# Patient Record
Sex: Male | Born: 2000
Health system: Southern US, Community
[De-identification: ages and names within clinical notes are randomized; demographics above are authoritative.]

---
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
## 2022-02-24 ENCOUNTER — Emergency Department (HOSPITAL_BASED_OUTPATIENT_CLINIC_OR_DEPARTMENT_OTHER)
Admission: EM | Admit: 2022-02-24 | Discharge: 2022-02-24 | Disposition: A | Discharge status: Home / Self Care | Payer: Medicaid Other | Attending: Emergency Medicine | Admitting: Emergency Medicine

## 2022-02-24 ENCOUNTER — Other Ambulatory Visit: Payer: Self-pay

## 2022-02-24 ENCOUNTER — Encounter (HOSPITAL_BASED_OUTPATIENT_CLINIC_OR_DEPARTMENT_OTHER): Payer: Self-pay | Admitting: Emergency Medicine

## 2022-02-24 DIAGNOSIS — U071 COVID-19: Secondary | ICD-10-CM

## 2022-02-24 DIAGNOSIS — R000 Tachycardia, unspecified: Secondary | ICD-10-CM | POA: Diagnosis not present

## 2022-02-24 DIAGNOSIS — R059 Cough, unspecified: Secondary | ICD-10-CM | POA: Diagnosis present

## 2022-02-24 LAB — GROUP A STREP BY PCR: Group A Strep by PCR: NOT DETECTED

## 2022-02-24 LAB — RESP PANEL BY RT-PCR (RSV, FLU A&B, COVID)  RVPGX2
Influenza A by PCR: NEGATIVE
Influenza B by PCR: NEGATIVE
Resp Syncytial Virus by PCR: NEGATIVE
SARS Coronavirus 2 by RT PCR: POSITIVE — AB

## 2022-02-24 MED ORDER — BENZONATATE 100 MG PO CAPS: 100.0000 mg | ORAL_CAPSULE | Freq: Three times a day (TID) | ORAL | 0 refills | Status: DC

## 2022-02-24 NOTE — ED Provider Notes (Signed)
[facility] EMERGENCY DEPT Provider Note   CSN: [id number] Arrival date & time: 02/24/22  1553     History  Chief Complaint  Patient presents with   Cough    Gary Houston is a 22 y.o. male presenting with viral symptoms.  Sister is also sick.  Mother's boyfriend is also sick.  No shortness of breath.  No nausea vomiting or diarrhea.  Complaining of congestion, rhinorrhea and cough.   Cough      Home Medications Prior to Admission medications   Medication Sig Start Date End Date Taking? Authorizing Provider  benzonatate (TESSALON) 100 MG capsule Take 1 capsule (100 mg total) by mouth every 8 (eight) hours. 02/24/22  Yes Coburn Knaus A, PA-C  benzonatate (TESSALON) 100 MG capsule Take 1 capsule (100 mg total) by mouth every 8 (eight) hours. 02/24/22   Dilcia Rybarczyk A, PA-C      Allergies    Patient has no known allergies.    Review of Systems   Review of Systems  Respiratory:  Positive for cough.     Physical Exam Updated Vital Signs BP (!) 145/95 (BP Location: Right Arm)   Pulse (!) 109   Temp 98.4 F (36.9 C)   Resp 18   SpO2 100%  Physical Exam Vitals and nursing note reviewed.  Constitutional:      General: He is not in acute distress.    Appearance: Normal appearance. He is not ill-appearing.  HENT:     Head: Normocephalic and atraumatic.     Nose: Congestion and rhinorrhea present.     Mouth/Throat:     Mouth: Mucous membranes are moist.     Pharynx: Oropharynx is clear.  Eyes:     General: No scleral icterus.    Conjunctiva/sclera: Conjunctivae normal.  Cardiovascular:     Rate and Rhythm: Regular rhythm. Tachycardia present.  Pulmonary:     Effort: Pulmonary effort is normal. No respiratory distress.     Breath sounds: No wheezing or rales.  Skin:    General: Skin is warm and dry.     Findings: No rash.  Neurological:     Mental Status: He is alert.  Psychiatric:        Mood and Affect: Mood normal.     ED  Results / Procedures / Treatments   Labs (all labs ordered are listed, but only abnormal results are displayed) Labs Reviewed  RESP PANEL BY RT-PCR (RSV, FLU A&B, COVID)  [id number] - Abnormal; Notable for the following components:      Result Value   SARS Coronavirus 2 by RT PCR POSITIVE (*)    All other components within normal limits  GROUP A STREP BY PCR    EKG None  Radiology No results found.  Procedures Procedures   Medications Ordered in ED Medications - No data to display  ED Course/ Medical Decision Making/ A&P                           Medical Decision Making Risk Prescription drug management.   22 year old presenting today with URI symptoms.  Symptoms have been going on for 3 days.  On physical exam they are stable in appearance.  Tested positive for COVID. We discussed that COVID is something that needs to run its course and they may use ibuprofen, Tylenol, DayQuil/NyQuil and other over-the-counter medications for their symptoms.  Return precautions discussed and they understand that they may follow-up on  their symptoms outpatient with either PCP or urgent care as needed for nonemergent symptoms.  Agreeable to discharge at this time.    Final Clinical Impression(s) / ED Diagnoses Final diagnoses:  [id number]    Rx / DC Orders ED Discharge Orders          Ordered    benzonatate (TESSALON) 100 MG capsule  Every 8 hours        02/24/22 1707    benzonatate (TESSALON) 100 MG capsule  Every 8 hours        02/24/22 1723           Results and diagnoses were explained to the patient. Return precautions discussed in full. Patient had no additional questions and expressed complete understanding.   This chart was dictated using voice recognition software.  Despite best efforts to proofread,  errors can occur which can change the documentation meaning.    Rhae Hammock, PA-C 14/78/29 5621    Lianne Cure, DO 30/86/57 2358

## 2022-02-24 NOTE — ED Triage Notes (Signed)
Cough sore throat started a few days ago. Theraflu with minimal symptom improvement

## 2022-02-24 NOTE — Discharge Instructions (Addendum)
You came to the department today due to flulike symptoms.  You tested + covid.  This sort of viral illness is something that can be treated with over-the-counter medications like Tylenol and ibuprofen.  Other options include DayQuil/NyQuil, Mucinex for congestion, Robitussin/Delsym for cough and any other over-the-counter medications.  It is very important that you stay hydrated during this time as well.  You may use things like Powerade, Gatorade, electrolyte powders and water.  We hope that you feel better and do not hesitate to return to the emergency department with any worsening symptoms, especially chest pain, shortness of breath, dizziness and loss of consciousness.

## 2022-02-24 NOTE — ED Notes (Signed)
Patient and parent verbalizes understanding of discharge instructions. Opportunity for questioning and answers were provided. Patient discharged from ED with parent.  

## 2022-04-11 ENCOUNTER — Encounter (INDEPENDENT_AMBULATORY_CARE_PROVIDER_SITE_OTHER): Payer: Self-pay | Admitting: Pediatrics

## 2022-04-14 ENCOUNTER — Encounter (INDEPENDENT_AMBULATORY_CARE_PROVIDER_SITE_OTHER): Payer: Self-pay | Admitting: Pediatrics

## 2022-04-15 ENCOUNTER — Encounter (INDEPENDENT_AMBULATORY_CARE_PROVIDER_SITE_OTHER): Payer: Self-pay | Admitting: Pediatrics

## 2022-04-15 ENCOUNTER — Ambulatory Visit (INDEPENDENT_AMBULATORY_CARE_PROVIDER_SITE_OTHER): Payer: Medicaid Other | Admitting: Pediatrics

## 2022-04-15 VITALS — BP 112/70 | HR 68 | Ht 66.73 in | Wt 145.0 lb

## 2022-04-15 DIAGNOSIS — R620 Delayed milestone in childhood: Secondary | ICD-10-CM

## 2022-04-15 DIAGNOSIS — Q9382 Williams syndrome: Secondary | ICD-10-CM | POA: Diagnosis not present

## 2022-04-15 NOTE — Progress Notes (Signed)
Pediatric Teaching Program [address] 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  He is accompanied to clinic by this mother, Gary Houston, and maternal grandmother, Gary Houston.   Gary Houston was last evaluated in the [facility] clinic in October 2013 when Gary Houston was nearly 22 years of age. His features and medical history were suggestive a Williams syndrome at that time.  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  Additional molecular cytogenetic study showed the absence of the ELN gene for one chromosome.  CARDIOVASCULAR:  Gary Houston has a history of mild pulmonary valve stenosis and was followed as an infant and child by pediatric cardiologist, Dr. Fletcher Anon and then [facility] pediatric cardiology.  An echocardiogram in 2009 was normal.  A more recent cardiology evaluation by pediatric cardiologist, Dr. Elvia Collum of [facility] was normal.  There is no known hypertension.  HEENT: Gary Houston mother reports no concerns regarding vision or hearing. There has been regular follow-up.  However, Gary Houston's mother reports a need for orthodontic evaluations.   DERM:  Gary Houston was evaluated three years ago for hyperpigmented arm lesions. No specific treatment was deemed necessary and the differential diagnosis was  "lichen simplex chronicus versus localized cutis laxa."  MSK: In 2018, Gary Houston was evaluated for a ganglion cyst of the foot that was excised [facility]).   NEURO/DEVELOPMENTAL:  Gary Houston did have an IEP throughout his school programs and was most recently home-schooled (from 10th grade on)  with completion of high school.    There was early concern for hypotonia.  A brain MRI performed at [facility] in  2010 showed no intracranial abnormality.  There has not been an indication for further brain imaging.   Other review of systems:  Gary Houston has not had any recent surgeries.  There was a COVID19 infection 2 months ago. There have not been seizures.   FAMILY HISTORY UPDATE: Gary Houston 85 year old sister will attend [employer] [place] this fall with the plans for an engineering major. There are no further updates.   Physical Examination: BP 112/70   Pulse 68   Ht 5' 6.73" (1.695 m)   Wt 65.8 kg   HC 58.4 cm (23")   BMI 22.89 kg/m  WEIGHT 36th percentile (75th-90th Williams syndrome growth curve); Height 16th percentile (75th-90th Williams syndrome growth curve)   Head/facies    Somewhat low anterior hairline. Head measurement with braided hair  HC: (75th-90th Williams syndrome growth curve)  Eyes PERRL, periorbital fullness,    Ears Normally formed  Mouth Full and wide lips. Some dental malocclusion  Neck Slightly long neck, no thyromegaly  Chest No murmur  Abdomen No umbilical hernia  Genitourinary Normal male, TANNER stage V.   Musculoskeletal Mild hyperextensibility of wrists, no scoliosis  Neuro Normal tone and strength.   Skin/Integument No unusual skin lesions   ASSESSMENT:  Gary Houston is a 22 year old male who has a diagnosis of Williams syndrome when he was nearly 22 years of age.  He has some characteristic facial features of Williams syndrome as well as history of mild pulmonic stenosis. Gary Houston's blood pressure is normal today.  Gary Houston has completed his educational program up to [employer] level. He has not had any major medical problems.  The largest concern for the family is Gary Houston's transition to adult health care and life skills at the adult ages.   Genetic counselor, Zonia Kiefandi Stewart, [employer] genetic counseling intern, Shellia CleverlyAvia Sutton, and I reviewed the important aspects of care for adults with Williams syndrome.  The American Academy of Pediatrics has updated the guidelines in 2020 [place]  et al. [facility] Supervision for Children with Mayford KnifeWilliams Syndrome.  Pediatrics, 2020, 145e.    RESOURCES:   The Williams Syndrome Association has wonderful resources for children and adults with Williams syndrome. www.williams-syndrome.org    RECOMMENDATIONS:  Transitions for Life Skills Programs and Medical Care are being explored We provided information on [employer] [facility] Program We will also explore other options in our community and send that information to the family  The family is doing a wonderful job Doctor, hospitaladvocating for [employer].  We will continue to seek out resources that may be available in our community.     Link SnufferPamela J. Caralyn Twining, M.D., Ph.D. Clinical Professor, Pediatrics and Medical Genetics  Cc: Dr. Lavella HammockEndya Frye

## 2023-05-18 ENCOUNTER — Ambulatory Visit: Payer: Medicaid Other | Admitting: Family Medicine

## 2023-09-14 ENCOUNTER — Ambulatory Visit (INDEPENDENT_AMBULATORY_CARE_PROVIDER_SITE_OTHER): Payer: MEDICAID | Admitting: Podiatry

## 2023-09-14 DIAGNOSIS — B353 Tinea pedis: Secondary | ICD-10-CM | POA: Diagnosis not present

## 2023-09-14 MED ORDER — MUPIROCIN 2 % EX OINT: 1.0000 | TOPICAL_OINTMENT | Freq: Two times a day (BID) | CUTANEOUS | 2 refills | Status: AC

## 2023-09-14 MED ORDER — CLOTRIMAZOLE-BETAMETHASONE 1-0.05 % EX CREA: 1.0000 | TOPICAL_CREAM | Freq: Every day | CUTANEOUS | 2 refills | Status: AC

## 2023-09-14 MED ORDER — TRIAMCINOLONE ACETONIDE 0.5 % EX OINT: 1.0000 | TOPICAL_OINTMENT | Freq: Two times a day (BID) | CUTANEOUS | 2 refills | Status: AC

## 2023-09-14 NOTE — Progress Notes (Signed)
   Chief Complaint  Patient presents with   Skin Problem    Bilateral heel. Ongoing for 2 years. Patient has dry, thick skin on his heels. He has been clipping at it with fingernail clippers, scissors, whatever he is able to find. Has tried lotion, refuses to have pedicure. Mom and grandma are in room with him. Not diabetic and no anticoag. Patient has william's syndrome.     HPI: 23 y.o. male presenting today for evaluation of thick skin to the bilateral heels.  He normally tries to trim them out himself.  Presenting for further treatment evaluation  No past medical history on file.  No past surgical history on file.  No Known Allergies    RT heel 09/14/2023   Physical Exam: General: The patient is alert and oriented x3 in no acute distress.  Dermatology: Skin is warm, dry and supple bilateral lower extremities.  Hyperkeratotic skin noted to the bilateral heels with mild hydrocyst  Vascular: Palpable pedal pulses bilaterally. Capillary refill within normal limits.  No appreciable edema.  No erythema.  Neurological: Grossly intact via light touch  Musculoskeletal Exam: No pedal deformities noted  Assessment/Plan of Care: 1.  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 Thresa EMERSON Sar, DPM    [address] [place], [place] [address]                Office [phone]  Fax [phone]

## 2023-09-14 NOTE — Patient Instructions (Signed)
 Mupirocin  ointment for the foot Lotrisone  cream for the foot  Triamcinolone  (Kenalog ) ointment for the skin lesion

## 2024-01-13 ENCOUNTER — Ambulatory Visit (INDEPENDENT_AMBULATORY_CARE_PROVIDER_SITE_OTHER): Payer: MEDICAID | Admitting: Family Medicine

## 2024-01-13 VITALS — BP 132/73 | HR 117 | Temp 98.3°F | Wt 172.4 lb

## 2024-01-13 DIAGNOSIS — R030 Elevated blood-pressure reading, without diagnosis of hypertension: Secondary | ICD-10-CM | POA: Diagnosis not present

## 2024-01-13 DIAGNOSIS — Z7689 Persons encountering health services in other specified circumstances: Secondary | ICD-10-CM

## 2024-01-13 DIAGNOSIS — J310 Chronic rhinitis: Secondary | ICD-10-CM

## 2024-01-13 DIAGNOSIS — Q9382 Williams syndrome: Secondary | ICD-10-CM

## 2024-01-13 NOTE — Progress Notes (Signed)
 "  Established Patient Office Visit   Subjective  Patient ID: Gary Houston, male    DOB: 02/08/2001  Age: 23 y.o. MRN: [id number]  Chief Complaint  Patient presents with   Establish Care    Patient is new patient. Patient has nasal drip and guardian wants patient to get blood work done.    Pt is a 23 yo male seen for est care and f/u on chronic conditions.  Pt previously seen at [facility] peds by Dr. Delight.  He denies any current medication use and has no known allergies to medications or foods. He has not received a flu shot this year and his last physical was at age 42.  H/o Gary Houston' syndrome.  Dx'd after genetic testing.  Pt with persistent runny nose.  Frequently having to blow nose.  Has not tried any medications.  Denies throat irritation, watery or itchy eyes, ear pain, facial pain/pressure, cough, or headaches.  No prior h/o allergies.  May be in room with windows down more.  Birth hx:  Born via emergency C-section due to a drop in heart rate and had a small heart murmur at birth. He was born at nearly 41 weeks and had low oxygen levels at birth.  H/o heart mumur, now resolved.  Allergies: NKDA  Social hx: Pt is an introvert and tends to stay in his room.  He enjoys fast food.  Family history: Younger sister, age 43 AAW. MGM-h/o renal txp d/t renal failure 2/2 untreated abscessed tooth Dad- DM MGF-h/o cancer related to Agent Orange exposure, and no known history of cancer in other family members.      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 No past surgical history on file. Social History   Tobacco Use   Smoking status: Never    Passive exposure: Never   Smokeless tobacco: Never  Vaping Use   Vaping status: Never Used  Substance Use Topics   Alcohol use: Never   Drug use: Never   Family History  Problem  Relation Age of Onset   Kidney disease Maternal Grandmother    No Known Allergies  ROS Negative unless stated above    Objective:     BP 132/73 (BP Location: Left Arm, Patient Position: Sitting, Cuff Size: Normal)   Pulse (!) 117   Temp 98.3 F (36.8 C) (Oral)   Wt 172 lb 6.4 oz (78.2 kg)   SpO2 98%   BMI 27.22 kg/m  BP Readings from Last 3 Encounters:  01/13/24 132/73  04/15/22 112/70  02/24/22 (!) 145/95   Wt Readings from Last 3 Encounters:  01/13/24 172 lb 6.4 oz (78.2 kg)  04/15/22 145 lb (65.8 kg) (36%, Z= -0.35)*  11/22/19 149 lb 6.4 oz (67.8 kg) (63%, Z= 0.33)*   * Growth percentiles are based on CDC (Boys, 2-20 Years) data.      Physical Exam Constitutional:      General: He is not in acute distress.    Appearance: Normal appearance.  HENT:     Head: Normocephalic and atraumatic.     Right Ear: Hearing, tympanic membrane and ear canal normal. Tenderness present.     Left Ear: Hearing, tympanic membrane and ear canal normal.     Ears:     Comments: R canal tender.  No injury, erythema, induration, or drainage.    Nose:  Nose normal.     Mouth/Throat:     Mouth: Mucous membranes are moist.  Cardiovascular:     Rate and Rhythm: Normal rate and regular rhythm.     Heart sounds: Normal heart sounds. No murmur heard.    No gallop.  Pulmonary:     Effort: Pulmonary effort is normal. No respiratory distress.     Breath sounds: Normal breath sounds. No wheezing, rhonchi or rales.  Skin:    General: Skin is warm and dry.  Neurological:     Mental Status: He is alert and oriented to person, place, and time.     No results found for any visits on 01/13/24.    Assessment & Plan:   Chronic rhinitis  Williams syndrome  Elevated blood pressure reading without diagnosis of hypertension  Encounter to establish care  Allergies likely contributing to chronic rhinitis.  Discussed starting an OTC antihistamine, saline nasal rinse, or flonase.  Avoid leaving  windows down in room as may be causing increased symptoms.  Also consider heartburn.  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  ID a/w syndrome.  BP initially elevated.  Contributed to White coat HTN/meeting new provider.  Recheck improved, 132/73, but generally lower per chart review.  Monitor for HTN as 1/2 of pt's develop.  For continued elevation in BP, renal u/s and start norvasc or procardia.  Also monitor for hypercalcemia, hypothyroidism, and DM.  Can also have renal manifestations including frequent UTIs.  Annual cardiovascular, hearing, and vision screenings.  -We reviewed the PMH, PSH, FH, SH, Meds and Allergies. -We provided refills for any medications we will prescribe as needed. -We addressed current concerns per orders and patient instructions. -We have asked for records for pertinent exams, studies, vaccines and notes from previous providers. -We have advised patient to follow up per instructions below.   Return if symptoms worsen or fail to improve.   Gary Houston Single, MD "

## 2024-01-28 ENCOUNTER — Ambulatory Visit: Payer: Self-pay

## 2024-01-28 NOTE — Telephone Encounter (Signed)
 Pt has appt 02/11/17

## 2024-01-28 NOTE — Telephone Encounter (Signed)
 FYI Only or Action Required?: FYI only for provider: appointment scheduled on 01/29/24.  Patient was last seen in primary care on 01/13/2024 by Mercer Clotilda SAUNDERS, MD.  Called Nurse Triage reporting URI.  Symptoms began several weeks ago.  Interventions attempted: Nothing.  Symptoms are: unchanged.  Triage Disposition: See PCP When Office is Open (Within 3 Days)  Patient/caregiver understands and will follow disposition?:   Reason for Disposition  [1] Sinus congestion (pressure, fullness) AND [2] present > 10 days  [1] Nasal discharge AND [2] present > 10 days  Answer Assessment - Initial Assessment Questions 1. ONSET: When did the nasal discharge start?      Several weeks ago 2. AMOUNT: How much discharge is there?      Large amt 3. COUGH: Do you have a cough? If Yes, ask: Describe the color of your mucus. (e.g., clear, white, yellow, green)     Mucus is green at times. Cough mild 4. RESPIRATORY DISTRESS: Describe your breathing.      Denies sob 5. FEVER: Do you have a fever? If Yes, ask: What is your temperature, how was it measured, and when did it start?     no 6. SEVERITY: Overall, how bad are you feeling right now? (e.g., doesn't interfere with normal activities, staying home from school/work, staying in bed)      same 7. OTHER SYMPTOMS: Do you have any other symptoms? (e.g., earache, mouth sores, sore throat, wheezing)     no  Protocols used: Sinus Pain or Congestion-A-AH, Common Cold-A-AH  Reason for Triage: Patient mother called in stating he has symptoms of a Sinus Infection: He has had the following symptoms since before: Thanksgiving Holiday - Runny Nose/Mucus/ Thick White Mucus wont go away. - Coughing

## 2024-01-29 ENCOUNTER — Ambulatory Visit (INDEPENDENT_AMBULATORY_CARE_PROVIDER_SITE_OTHER): Payer: MEDICAID | Admitting: Family Medicine

## 2024-01-29 ENCOUNTER — Encounter: Payer: Self-pay | Admitting: Family Medicine

## 2024-01-29 VITALS — BP 130/90 | HR 98 | Temp 98.6°F | Ht 66.73 in | Wt 173.0 lb

## 2024-01-29 DIAGNOSIS — Q9382 Williams syndrome: Secondary | ICD-10-CM

## 2024-01-29 DIAGNOSIS — R030 Elevated blood-pressure reading, without diagnosis of hypertension: Secondary | ICD-10-CM | POA: Diagnosis not present

## 2024-01-29 DIAGNOSIS — J309 Allergic rhinitis, unspecified: Secondary | ICD-10-CM

## 2024-01-29 MED ORDER — FEXOFENADINE HCL 180 MG PO TABS: 180.0000 mg | ORAL_TABLET | Freq: Every day | ORAL | 3 refills | Status: AC

## 2024-01-29 NOTE — Progress Notes (Signed)
 "  Established Patient Office Visit   Subjective  Patient ID: Gary Houston, male    DOB: October 23, 2000  Age: 23 y.o. MRN: [id number]  Chief Complaint  Patient presents with   Acute Visit    Patient is here for what they think is a sinus infection, congestion, thick mucous, for about 1 month  Pt accompanied by his mother and MGM.  Pt is a 22 yo male who recently est care.  Pt seen for continued thick clear nasal drainage x 1 month.  Frequently having to blow nose. Advised to start antihistamine.  Briefly tried OTC sudafed.  Mom concerned pt has a sinus infection.  Pt denies fever, HA, facial pain/pressure, ear pain /pressure, ST, cough, fatigue.  Bp elevated.  Pt had [employer] prior to appt.      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 No pertinent past medical history. History reviewed. No pertinent surgical history. Social History[1] Family History  Problem Relation Age of Onset   Kidney disease Maternal Grandmother    Allergies[2]  ROS Negative unless stated above    Objective:     BP (!) 130/90 (BP Location: Right Arm, Patient Position: Sitting, Cuff Size: Large)   Pulse 98   Temp 98.6 F (37 C) (Oral)   Ht 5' 6.73 (1.695 m)   Wt 173 lb (78.5 kg)   SpO2 100%   BMI 27.32 kg/m  BP Readings from Last 3 Encounters:  01/29/24 (!) 130/90  01/13/24 (!) 142/100  04/15/22 112/70   Wt Readings from Last 3 Encounters:  01/29/24 173 lb (78.5 kg)  01/13/24 172 lb 6.4 oz (78.2 kg)  04/15/22 145 lb (65.8 kg) (83%, Z= 0.95)*   * Growth percentiles are based on Williams (Boys, 0-20 Years) data.      Physical Exam Constitutional:      General: He is not in acute distress.    Appearance: Normal appearance.  HENT:     Head: Normocephalic and atraumatic.     Nose: Rhinorrhea present. No nasal tenderness. Rhinorrhea is clear.      Right Sinus: No maxillary sinus tenderness or frontal sinus tenderness.     Left Sinus: No maxillary sinus tenderness or frontal sinus tenderness.     Mouth/Throat:     Mouth: Mucous membranes are moist.  Cardiovascular:     Rate and Rhythm: Normal rate and regular rhythm.     Heart sounds: Normal heart sounds. No murmur heard.    No gallop.  Pulmonary:     Effort: Pulmonary effort is normal. No respiratory distress.     Breath sounds: Normal breath sounds. No wheezing, rhonchi or rales.  Skin:    General: Skin is warm and dry.  Neurological:     Mental Status: He is alert and oriented to person, place, and time.    No results found for any visits on 01/29/24.    Assessment & Plan:   Allergic rhinitis, unspecified seasonality, unspecified trigger -     Fexofenadine  HCl; Take 1 tablet (180 mg total) by mouth daily.  Dispense: 90 tablet; Refill: 3  Elevated blood pressure reading  Williams syndrome   Chronic rhinorrhea thought 2/2 to allergies, seasonal and environmental.  No s/s of sinusitis, abx not indicated.  OTC antihistamine again advised.  Tried OTC decongestant.  Given rx for allegra .  Can also try saline nasal rinse  or flonase if needed.   BP elevated.  Recheck.  Discussed the importance of lifestyle modifications.  May be elevated due to intake of fast food prior to appt.  Monitor at home.  For continued elevation start norvasc or procardia as half of patients with Williams syndrome will develop HTN.  Return if symptoms worsen or fail to improve.   Gary Houston Single, MD     [1]  Social History Tobacco Use   Smoking status: Never    Passive exposure: Never   Smokeless tobacco: Never  Vaping Use   Vaping status: Never Used  Substance Use Topics   Alcohol use: Never   Drug use: Never  [2] No Known Allergies  "

## 2024-02-01 ENCOUNTER — Other Ambulatory Visit (HOSPITAL_COMMUNITY): Payer: Self-pay
